# Patient Record
Sex: Male | Born: 1995 | Race: Black or African American | Hispanic: No | Marital: Single | Smoking: Never smoker
Health system: Southern US, Community
[De-identification: ages and names within clinical notes are randomized; demographics above are authoritative.]

---
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
## 2022-11-02 ENCOUNTER — Emergency Department (HOSPITAL_BASED_OUTPATIENT_CLINIC_OR_DEPARTMENT_OTHER)
Admission: EM | Admit: 2022-11-02 | Discharge: 2022-11-02 | Disposition: A | Discharge status: Home / Self Care | Payer: Managed Care, Other (non HMO) | Attending: Emergency Medicine | Admitting: Emergency Medicine

## 2022-11-02 ENCOUNTER — Other Ambulatory Visit: Payer: Self-pay

## 2022-11-02 ENCOUNTER — Encounter (HOSPITAL_BASED_OUTPATIENT_CLINIC_OR_DEPARTMENT_OTHER): Payer: Self-pay

## 2022-11-02 DIAGNOSIS — R109 Unspecified abdominal pain: Secondary | ICD-10-CM | POA: Diagnosis not present

## 2022-11-02 LAB — URINALYSIS, ROUTINE W REFLEX MICROSCOPIC
Bilirubin Urine: NEGATIVE
Glucose, UA: NEGATIVE mg/dL
Hgb urine dipstick: NEGATIVE
Ketones, ur: NEGATIVE mg/dL
Leukocytes,Ua: NEGATIVE
Nitrite: NEGATIVE
Protein, ur: NEGATIVE mg/dL
Specific Gravity, Urine: 1.02 (ref 1.005–1.030)
pH: 7.5 (ref 5.0–8.0)

## 2022-11-02 MED ORDER — NAPROXEN 250 MG PO TABS: 250.0000 mg | ORAL_TABLET | Freq: Two times a day (BID) | ORAL | 0 refills | Status: AC

## 2022-11-02 NOTE — ED Provider Notes (Signed)
[facility] Provider Note   CSN: [id number] Arrival date & time: 11/02/22  1538     History  Chief Complaint  Patient presents with   Flank Pain    L    Jesus Yates is a 27 y.o. male.  HPI Patient reports he had some pain in his left side several days ago.  He reports it was a little achy and not as severe pain.  No specific injury that he is aware of.  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  Patient reports about a week or more ago he thought he was having a little bit of difficulty urinating but was not having any pain or burning.  He did not have any fever or flank pain at that time.  That symptom has resolved.  He reports today he was at work and the pain seemed worse so he left work and came to the hospital for evaluation.  No history of similar pain.  No personal history of kidney stones.  No associated testicular pain.  No associated cough, shortness of breath or chest pain.    Home Medications Prior to Admission medications   Medication Sig Start Date End Date Taking? Authorizing Provider  naproxen (NAPROSYN) 250 MG tablet Take 1 tablet (250 mg total) by mouth 2 (two) times daily. 11/02/22  Yes Charlesetta Shanks, MD  HYDROcodone-acetaminophen (NORCO/VICODIN) 5-325 MG per tablet Take 1-2 tablets by mouth every 4 (four) hours as needed. 11/11/13   Mabe, Forbes Cellar, MD  ibuprofen (ADVIL,MOTRIN) 800 MG tablet Take 1 tablet (800 mg total) by mouth 3 (three) times daily. 11/11/13   Mabe, Forbes Cellar, MD  ondansetron (ZOFRAN ODT) 8 MG disintegrating tablet Take 1 tablet (8 mg total) by mouth every 8 (eight) hours as needed for nausea. 05/09/12   Molpus, John, MD  ondansetron (ZOFRAN ODT) 8 MG disintegrating tablet 8mg  ODT q8 hours prn nausea 10/10/13   Palumbo, April, MD      Allergies    Patient has no known allergies.    Review of Systems   Review of Systems  Physical Exam Updated Vital Signs BP 120/70 (BP  Location: Left Arm)   Pulse 86   Temp 98.4 F (36.9 C)   Resp 18   Ht 5\' 9"  (1.753 m)   Wt 72.6 kg   SpO2 98%   BMI 23.63 kg/m  Physical Exam Constitutional:      Comments: Alert nontoxic.  Well-nourished well-developed.  Clinically well in appearance.  No acute distress.  HENT:     Mouth/Throat:     Pharynx: Oropharynx is clear.  Cardiovascular:     Rate and Rhythm: Normal rate and regular rhythm.  Pulmonary:     Effort: Pulmonary effort is normal.     Breath sounds: Normal breath sounds.  Abdominal:     General: There is no distension.     Palpations: Abdomen is soft.     Tenderness: There is no abdominal tenderness. There is no guarding.     Comments: Patient has no reproducible abdominal pain.  No mass no fullness.  No pain to brisk percussion of the flank and lower back.  No skin changes.  Musculoskeletal:        General: Normal range of motion.     Comments: No lower extremity edema or swelling.  No calf tenderness.  No popliteal fossa tenderness.  Skin:    General: Skin is warm and  dry.  Neurological:     General: No focal deficit present.     Mental Status: He is oriented to person, place, and time.     Coordination: Coordination normal.  Psychiatric:        Mood and Affect: Mood normal.     ED Results / Procedures / Treatments   Labs (all labs ordered are listed, but only abnormal results are displayed) Labs Reviewed  URINALYSIS, ROUTINE W REFLEX MICROSCOPIC  LIPASE, BLOOD  COMPREHENSIVE METABOLIC PANEL  CBC    EKG None  Radiology No results found.  Procedures Procedures    Medications Ordered in ED Medications - No data to display  ED Course/ Medical Decision Making/ A&P                             Medical Decision Making Amount and/or Complexity of Data Reviewed Labs: ordered.   Patient presents as outlined.  He has had several days of waxing and waning discomfort in the left lateral abdomen towards the side of the flank.  No  associated symptoms.  At this time, patient does not have any reproducible pain on physical examination.  Differential diagnosis includes kidney stone\UTI\muscular strain\pneumonia.  Urinalysis normal without blood or white cells.  At this time with no blood in the urine and symptoms resolved, lower suspicion for retained kidney stone.  Possibly patient passed a stone earlier.  Based on resolution of symptoms, normal exam and normal UA, I do not feel that patient needs CT imaging at this time.  I did review return precautions with the patient and his mother at bedside in the event that there should be a recurrent significant pain, fever or blood.  Lower suspicion for pneumonia or intrathoracic process.  Patient points to pain right at the lower edge of the thoracic cage in the abdominal area.  There is nothing reproducible here.  He does not have any associated symptoms of shortness of breath, fever, productive cough, lower extremity swelling to clinically suggest PE or pneumonia.  Patient has normal blood pressure, heart rate and oxygenation status.  Very low probability for these diagnoses.  At this time I do not feel he needs additional diagnostic imaging for these conditions.  As the patient is very well in appearance and has normal vital signs with normal exam, plan will be for follow-up with PCP for recheck next week.  Will treat with naproxen for suspected muscular strain.  Careful return precautions for fever, sudden worsening pain, dysuria or blood in the urine reviewed with patient and mother they  endorsed understanding of follow-up plan and return precautions        Final Clinical Impression(s) / ED Diagnoses Final diagnoses:  Left flank pain    Rx / DC Orders ED Discharge Orders          Ordered    naproxen (NAPROSYN) 250 MG tablet  2 times daily        11/02/22 1925              Charlesetta Shanks, MD 11/02/22 1936

## 2022-11-02 NOTE — ED Triage Notes (Signed)
Pt c/o LLQ/ L flank onset Friday/ Saturday. States he was at work today, pain worsened to the point he had to leave. Denies additional symptoms

## 2022-11-02 NOTE — Discharge Instructions (Signed)
1.  At this time your urine does not show any signs of infection.  There is no blood in your urine.  It is less likely that you have a kidney stone without any blood in the urine. 2.  Your pain may be due to a muscular strain.  Take naproxen as recommended twice a day for the next several days.  See if your pain resolves.  Stay well-hydrated. 3.  You should have a recheck within the next 5 to 10 days.  Schedule a recheck with your doctor. 4.  If your pain is suddenly worsening, you see blood in the urine, you get a fever or other concerning changes, return to the emergency department immediately for recheck.

## 2023-12-20 ENCOUNTER — Encounter (HOSPITAL_BASED_OUTPATIENT_CLINIC_OR_DEPARTMENT_OTHER): Payer: Self-pay | Admitting: Emergency Medicine

## 2023-12-20 ENCOUNTER — Emergency Department (HOSPITAL_BASED_OUTPATIENT_CLINIC_OR_DEPARTMENT_OTHER)
Admission: EM | Admit: 2023-12-20 | Discharge: 2023-12-21 | Disposition: A | Discharge status: Home / Self Care | Attending: Emergency Medicine | Admitting: Emergency Medicine

## 2023-12-20 ENCOUNTER — Other Ambulatory Visit: Payer: Self-pay

## 2023-12-20 DIAGNOSIS — R197 Diarrhea, unspecified: Secondary | ICD-10-CM | POA: Diagnosis not present

## 2023-12-20 DIAGNOSIS — R112 Nausea with vomiting, unspecified: Secondary | ICD-10-CM | POA: Insufficient documentation

## 2023-12-20 DIAGNOSIS — R42 Dizziness and giddiness: Secondary | ICD-10-CM | POA: Diagnosis not present

## 2023-12-20 LAB — URINALYSIS, ROUTINE W REFLEX MICROSCOPIC
Bilirubin Urine: NEGATIVE
Glucose, UA: NEGATIVE mg/dL
Hgb urine dipstick: NEGATIVE
Ketones, ur: NEGATIVE mg/dL
Leukocytes,Ua: NEGATIVE
Nitrite: NEGATIVE
Protein, ur: NEGATIVE mg/dL
Specific Gravity, Urine: >=1.03 (ref 1.005–1.030)
pH: 6 (ref 5.0–8.0)

## 2023-12-20 LAB — CBC
HCT: 43.1 % (ref 39.0–52.0)
Hemoglobin: 14.3 g/dL (ref 13.0–17.0)
MCH: 30 pg (ref 26.0–34.0)
MCHC: 33.2 g/dL (ref 30.0–36.0)
MCV: 90.4 fL (ref 80.0–100.0)
Platelets: 198 10*3/uL (ref 150–400)
RBC: 4.77 MIL/uL (ref 4.22–5.81)
RDW: 12.1 % (ref 11.5–15.5)
WBC: 7.6 10*3/uL (ref 4.0–10.5)
nRBC: 0 % (ref 0.0–0.2)

## 2023-12-20 NOTE — ED Triage Notes (Signed)
 Pt via POV c/o n/v/weakness x 2 days. He went to UC earlier today and got rx for zofran  and has still been vomiting. He thinks he may have a stomach flu. No pain.

## 2023-12-21 LAB — COMPREHENSIVE METABOLIC PANEL WITH GFR
ALT: 51 U/L — ABNORMAL HIGH (ref 0–44)
AST: 32 U/L (ref 15–41)
Albumin: 4.7 g/dL (ref 3.5–5.0)
Alkaline Phosphatase: 63 U/L (ref 38–126)
Anion gap: 10 (ref 5–15)
BUN: 7 mg/dL (ref 6–20)
CO2: 28 mmol/L (ref 22–32)
Calcium: 9.6 mg/dL (ref 8.9–10.3)
Chloride: 101 mmol/L (ref 98–111)
Creatinine, Ser: 1.08 mg/dL (ref 0.61–1.24)
GFR, Estimated: >60 mL/min (ref >60)
Glucose, Bld: 97 mg/dL (ref 70–99)
Potassium: 3.8 mmol/L (ref 3.5–5.1)
Sodium: 140 mmol/L (ref 135–145)
Total Bilirubin: 0.4 mg/dL (ref 0.0–1.2)
Total Protein: 7.7 g/dL (ref 6.5–8.1)

## 2023-12-21 LAB — LIPASE, BLOOD: Lipase: 21 U/L (ref 11–51)

## 2023-12-21 MED ORDER — SODIUM CHLORIDE 0.9 % IV BOLUS
500.0000 mL | Freq: Once | INTRAVENOUS | Status: AC
  Administered 2023-12-21: 500 mL via INTRAVENOUS

## 2023-12-21 MED ORDER — ONDANSETRON HCL 4 MG/2ML IJ SOLN
4.0000 mg | Freq: Once | INTRAMUSCULAR | Status: AC
  Administered 2023-12-21: 4 mg via INTRAVENOUS
  Filled 2023-12-21: qty 2

## 2023-12-21 MED ORDER — MECLIZINE HCL 12.5 MG PO TABS: 12.5000 mg | ORAL_TABLET | Freq: Three times a day (TID) | ORAL | 0 refills | Status: AC | PRN

## 2023-12-21 MED ORDER — MECLIZINE HCL 25 MG PO TABS
25.0000 mg | ORAL_TABLET | Freq: Once | ORAL | Status: AC
  Administered 2023-12-21: 25 mg via ORAL
  Filled 2023-12-21: qty 1

## 2023-12-21 MED ORDER — ONDANSETRON 8 MG PO TBDP: ORAL_TABLET | ORAL | 0 refills | Status: AC

## 2023-12-21 NOTE — ED Notes (Signed)
 Pt passed PO challenge.

## 2023-12-21 NOTE — ED Notes (Signed)
 Pt anxious on assessment

## 2023-12-21 NOTE — ED Provider Notes (Signed)
  [facility] AT [facility] Provider Note   CSN: [id number] Arrival date & time: 12/20/23  2326     History  Chief Complaint  Patient presents with   Emesis    Jesus Yates is a 28 y.o. male.  The history is provided by the patient and a parent.  Emesis Severity:  Moderate Duration:  2 days Timing:  Sporadic Quality:  Stomach contents Progression:  Unchanged Chronicity:  New Recent urination:  Normal Context: not post-tussive   Relieved by:  Nothing Worsened by:  Nothing Ineffective treatments:  None tried Associated symptoms: diarrhea   Associated symptoms: no fever   Risk factors: sick contacts        Home Medications Prior to Admission medications   Medication Sig Start Date End Date Taking? Authorizing Provider  meclizine (ANTIVERT) 12.5 MG tablet Take 1 tablet (12.5 mg total) by mouth 3 (three) times daily as needed for dizziness. 12/21/23  Yes Kynslei Art, MD  ondansetron  (ZOFRAN -ODT) 8 MG disintegrating tablet 8mg  ODT q8 hours prn nausea 12/21/23  Yes Lashundra Shiveley, MD  HYDROcodone -acetaminophen  (NORCO/VICODIN) 5-325 MG per tablet Take 1-2 tablets by mouth every 4 (four) hours as needed. 11/11/13   Mabe, Mertha Abrahams, MD  ibuprofen  (ADVIL ,MOTRIN ) 800 MG tablet Take 1 tablet (800 mg total) by mouth 3 (three) times daily. 11/11/13   Mabe, Mertha Abrahams, MD  naproxen  (NAPROSYN ) 250 MG tablet Take 1 tablet (250 mg total) by mouth 2 (two) times daily. 11/02/22   Wynetta Heckle, MD  ondansetron  (ZOFRAN  ODT) 8 MG disintegrating tablet Take 1 tablet (8 mg total) by mouth every 8 (eight) hours as needed for nausea. 05/09/12   Molpus, John, MD  ondansetron  (ZOFRAN  ODT) 8 MG disintegrating tablet 8mg  ODT q8 hours prn nausea 10/10/13   Kendrah Lovern, MD      Allergies    Patient has no known allergies.    Review of Systems   Review of Systems  Constitutional:  Negative for fever.  HENT:  Negative for facial swelling.   Eyes:  Negative for  photophobia and visual disturbance.  Gastrointestinal:  Positive for diarrhea, nausea and vomiting.  All other systems reviewed and are negative.   Physical Exam Updated Vital Signs BP (!) 141/91 (BP Location: Right Arm)   Pulse 80   Temp 98.3 F (36.8 C) (Oral)   Resp 20   Ht 5\' 9"  (1.753 m)   Wt 79.4 kg   SpO2 100%   BMI 25.84 kg/m  Physical Exam Vitals and nursing note reviewed.  Constitutional:      General: He is not in acute distress.    Appearance: He is well-developed. He is not diaphoretic.  HENT:     Head: Normocephalic and atraumatic.     Nose: Nose normal.  Eyes:     Conjunctiva/sclera: Conjunctivae normal.     Pupils: Pupils are equal, round, and reactive to light.  Cardiovascular:     Rate and Rhythm: Normal rate and regular rhythm.     Pulses: Normal pulses.     Heart sounds: Normal heart sounds.  Pulmonary:     Effort: Pulmonary effort is normal.     Breath sounds: Normal breath sounds. No wheezing or rales.  Abdominal:     General: Bowel sounds are normal.     Palpations: Abdomen is soft.     Tenderness: There is no abdominal tenderness. There is no guarding or rebound.     Hernia: No hernia is present.  Musculoskeletal:  General: Normal range of motion.     Cervical back: Normal range of motion and neck supple.  Skin:    General: Skin is warm and dry.     Capillary Refill: Capillary refill takes less than 2 seconds.  Neurological:     General: No focal deficit present.     Mental Status: He is alert and oriented to person, place, and time.     Deep Tendon Reflexes: Reflexes normal.  Psychiatric:        Mood and Affect: Mood normal.        Behavior: Behavior normal.     ED Results / Procedures / Treatments   Labs (all labs ordered are listed, but only abnormal results are displayed) Results for orders placed or performed during the hospital encounter of 12/20/23  Urinalysis, Routine w reflex microscopic -Urine, Clean Catch    Collection Time: 12/20/23 11:40 PM  Result Value Ref Range   Color, Urine YELLOW YELLOW   APPearance CLEAR CLEAR   Specific Gravity, Urine >=1.030 1.005 - 1.030   pH 6.0 5.0 - 8.0   Glucose, UA NEGATIVE NEGATIVE mg/dL   Hgb urine dipstick NEGATIVE NEGATIVE   Bilirubin Urine NEGATIVE NEGATIVE   Ketones, ur NEGATIVE NEGATIVE mg/dL   Protein, ur NEGATIVE NEGATIVE mg/dL   Nitrite NEGATIVE NEGATIVE   Leukocytes,Ua NEGATIVE NEGATIVE  Lipase, blood   Collection Time: 12/20/23 11:41 PM  Result Value Ref Range   Lipase 21 11 - 51 U/L  Comprehensive metabolic panel   Collection Time: 12/20/23 11:41 PM  Result Value Ref Range   Sodium 140 135 - 145 mmol/L   Potassium 3.8 3.5 - 5.1 mmol/L   Chloride 101 98 - 111 mmol/L   CO2 28 22 - 32 mmol/L   Glucose, Bld 97 70 - 99 mg/dL   BUN 7 6 - 20 mg/dL   Creatinine, Ser 4.78 0.61 - 1.24 mg/dL   Calcium 9.6 8.9 - 29.5 mg/dL   Total Protein 7.7 6.5 - 8.1 g/dL   Albumin 4.7 3.5 - 5.0 g/dL   AST 32 15 - 41 U/L   ALT 51 (H) 0 - 44 U/L   Alkaline Phosphatase 63 38 - 126 U/L   Total Bilirubin 0.4 0.0 - 1.2 mg/dL   GFR, Estimated >62 >13 mL/min   Anion gap 10 5 - 15  CBC   Collection Time: 12/20/23 11:41 PM  Result Value Ref Range   WBC 7.6 4.0 - 10.5 K/uL   RBC 4.77 4.22 - 5.81 MIL/uL   Hemoglobin 14.3 13.0 - 17.0 g/dL   HCT 08.6 57.8 - 46.9 %   MCV 90.4 80.0 - 100.0 fL   MCH 30.0 26.0 - 34.0 pg   MCHC 33.2 30.0 - 36.0 g/dL   RDW 62.9 52.8 - 41.3 %   Platelets 198 150 - 400 K/uL   nRBC 0.0 0.0 - 0.2 %   No results found.   Radiology No results found.  Procedures Procedures    Medications Ordered in ED Medications  sodium chloride  0.9 % bolus 500 mL (500 mLs Intravenous New Bag/Given 12/21/23 0011)  meclizine (ANTIVERT) tablet 25 mg (25 mg Oral Given 12/21/23 0022)  ondansetron  (ZOFRAN ) injection 4 mg (4 mg Intravenous Given 12/21/23 0020)    ED Course/ Medical Decision Making/ A&P                                 Medical  Decision Making Patient with 2 days of nausea vomiting and diarrhea.  Seen at urgent care but zofran  pill (not ODT) did not stay in   Amount and/or Complexity of Data Reviewed Independent Historian: parent    Details: See above  External Data Reviewed: notes.    Details: Previous notes reviewed  Labs: ordered.    Details: Normal white count 7.6, normal hemoglobin 14.3, normal platelets. Normal sodium 140, normal potassium 3.8, normal creatinine.  Normal urine. Lipase is negative   Risk Prescription drug management. Risk Details: Patient is very well appearing.  Symptoms of vertigo and nausea vomiting and diarrhea.  Viral illness are often a trigger for vertigo.  Treated for both with improvement of symptoms.  Stable for discharge.  Strict returns.     Final Clinical Impression(s) / ED Diagnoses Final diagnoses:  Vertigo  Nausea vomiting and diarrhea    No signs of systemic illness or infection. The patient is nontoxic-appearing on exam and vital signs are within normal limits.  I have reviewed the triage vital signs and the nursing notes. Pertinent labs & imaging results that were available during my care of the patient were reviewed by me and considered in my medical decision making (see chart for details). After history, exam, and medical workup I feel the patient has been appropriately medically screened and is safe for discharge home. Pertinent diagnoses were discussed with the patient. Patient was given return precautions.    Rx / DC Orders ED Discharge Orders          Ordered    meclizine (ANTIVERT) 12.5 MG tablet  3 times daily PRN        12/21/23 0033    ondansetron  (ZOFRAN -ODT) 8 MG disintegrating tablet        12/21/23 0033              Izabell Schalk, MD 12/21/23 1610
